# Patient Record
Sex: Female | Born: 1985 | Race: Black or African American | Hispanic: No | Marital: Single | Smoking: Never smoker
Health system: Southern US, Community
[De-identification: ages and names within clinical notes are randomized; demographics above are authoritative.]

## PROBLEM LIST (undated history)

## (undated) DIAGNOSIS — J45909 Unspecified asthma, uncomplicated: Secondary | ICD-10-CM

---
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
## 2008-03-27 ENCOUNTER — Emergency Department (HOSPITAL_COMMUNITY): Admission: EM | Admit: 2008-03-27 | Discharge: 2008-03-27 | Payer: Self-pay | Admitting: Emergency Medicine

## 2008-07-15 ENCOUNTER — Inpatient Hospital Stay (HOSPITAL_COMMUNITY): Admission: AD | Admit: 2008-07-15 | Discharge: 2008-07-15 | Payer: Self-pay | Admitting: Obstetrics and Gynecology

## 2008-08-20 ENCOUNTER — Other Ambulatory Visit: Payer: Self-pay | Admitting: Emergency Medicine

## 2008-08-20 ENCOUNTER — Inpatient Hospital Stay (HOSPITAL_COMMUNITY): Admission: AD | Admit: 2008-08-20 | Discharge: 2008-08-20 | Payer: Self-pay | Admitting: Obstetrics & Gynecology

## 2008-10-04 ENCOUNTER — Inpatient Hospital Stay (HOSPITAL_COMMUNITY): Admission: AD | Admit: 2008-10-04 | Discharge: 2008-10-07 | Payer: Self-pay | Admitting: Obstetrics and Gynecology

## 2009-04-19 ENCOUNTER — Emergency Department (HOSPITAL_COMMUNITY): Admission: EM | Admit: 2009-04-19 | Discharge: 2009-04-19 | Payer: Self-pay | Admitting: Emergency Medicine

## 2009-11-04 IMAGING — US US OB LIMITED
1 series · 14 of 16 positions shown · non-contrast
Comparison: none

OBSTETRICAL ULTRASOUND:
 This ultrasound exam was performed in the [HOSPITAL] Ultrasound Department.  The OB US report was generated in the AS system, and faxed to the ordering physician.  This report is also available in [REDACTED] PACS.

[Series 1: us fetal bpp w/o nonstress · non-contrast · 16 acquisitions, 14 frames shown]
[im 1/16]
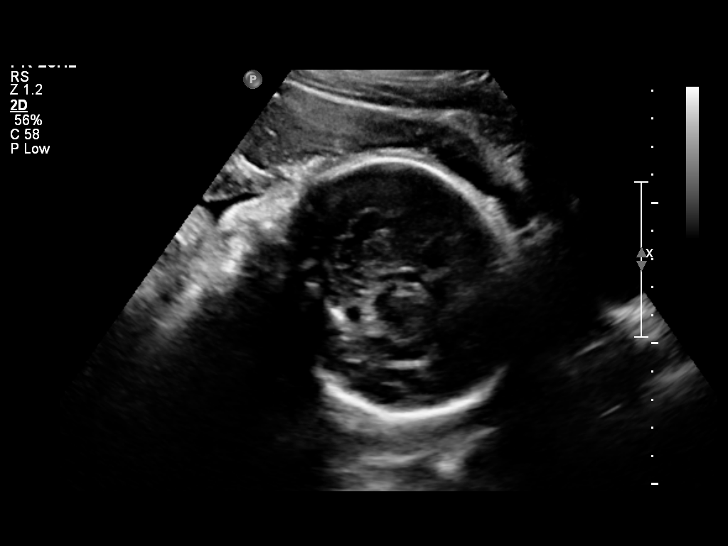
[im 2/16]
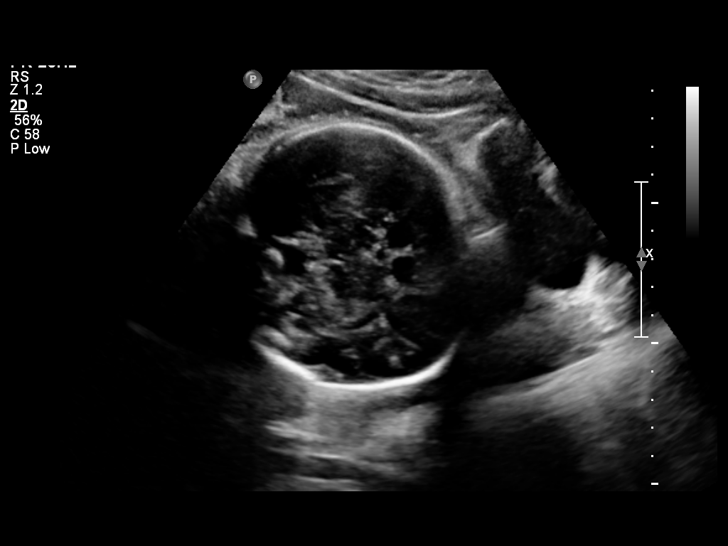
[im 3/16]
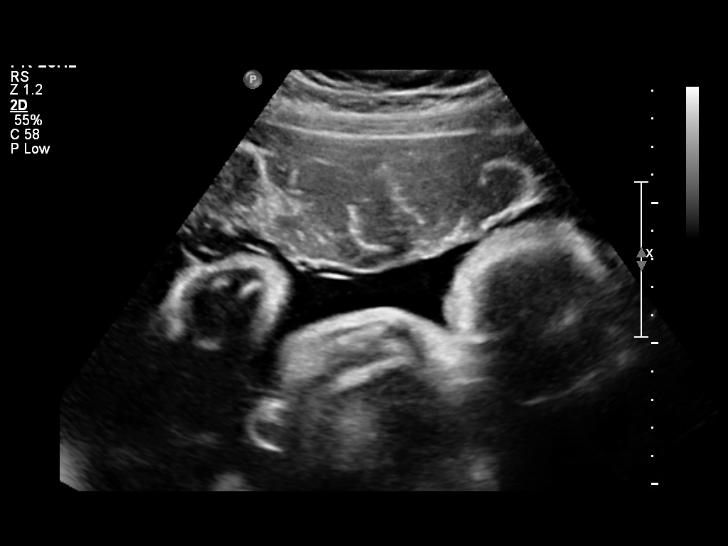
[im 5/16]
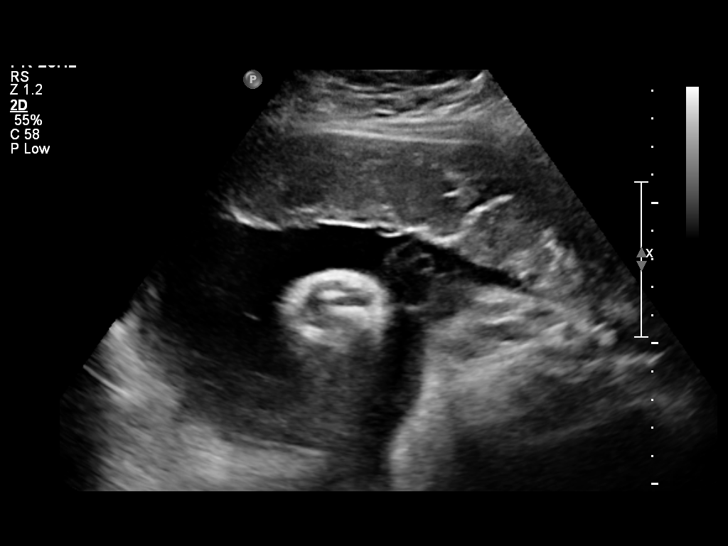
[im 6/16]
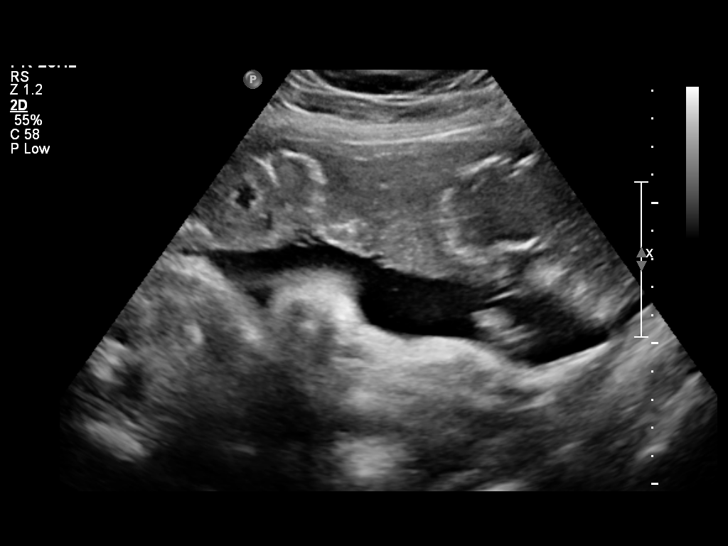
[im 7/16]
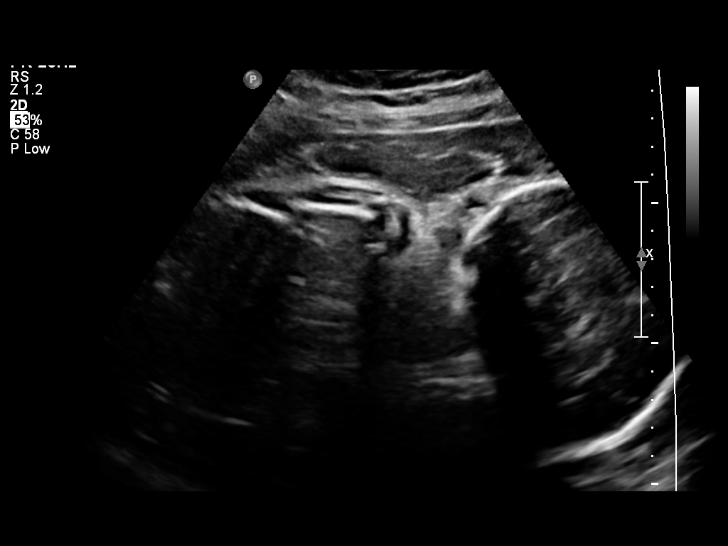
[im 8/16]
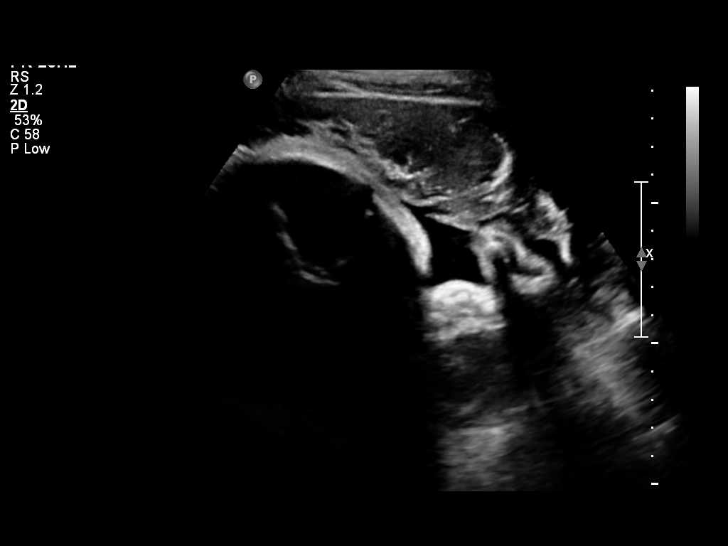
[im 9/16]
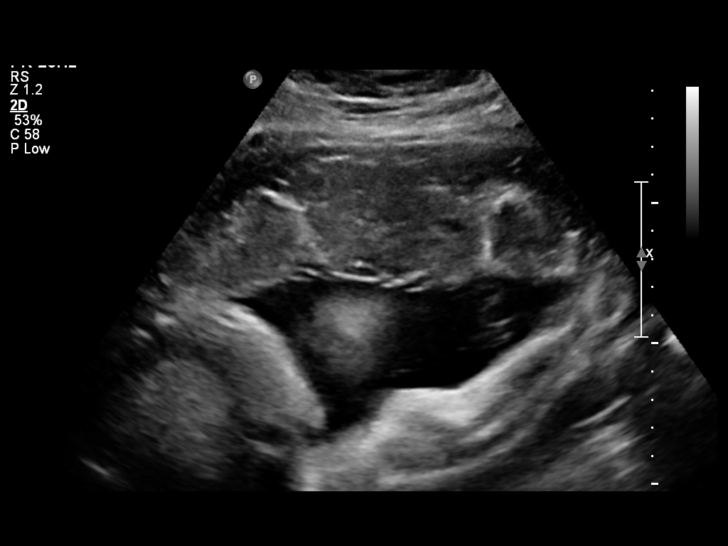
[im 10/16]
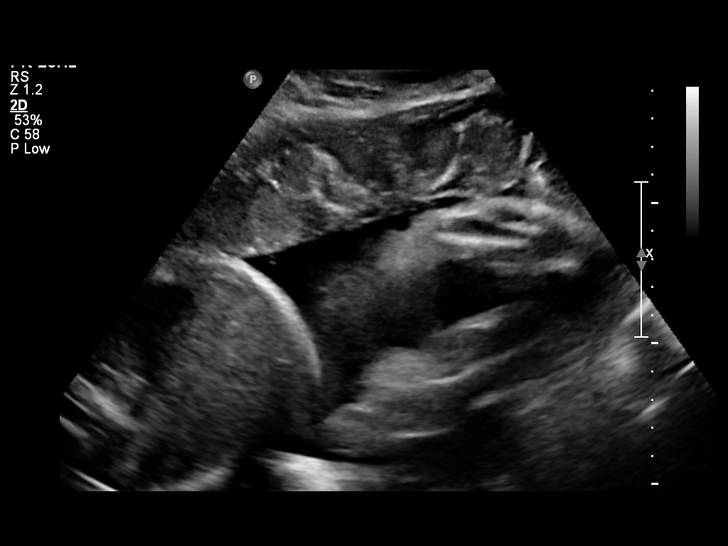
[im 11/16]
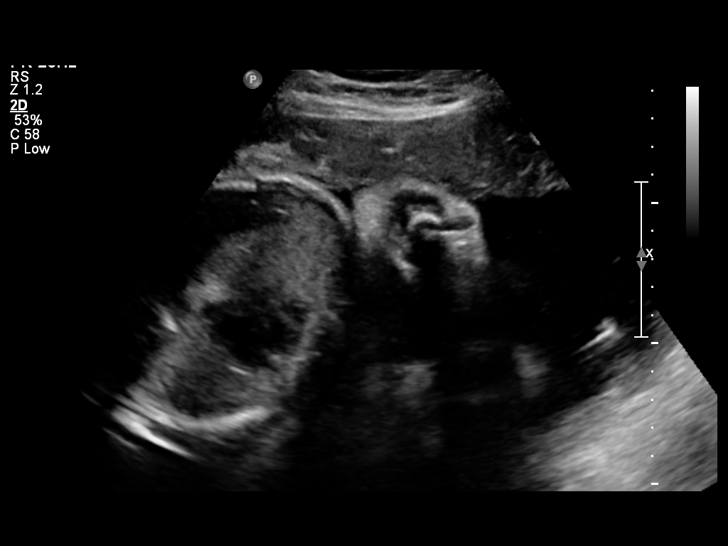
[im 13/16]
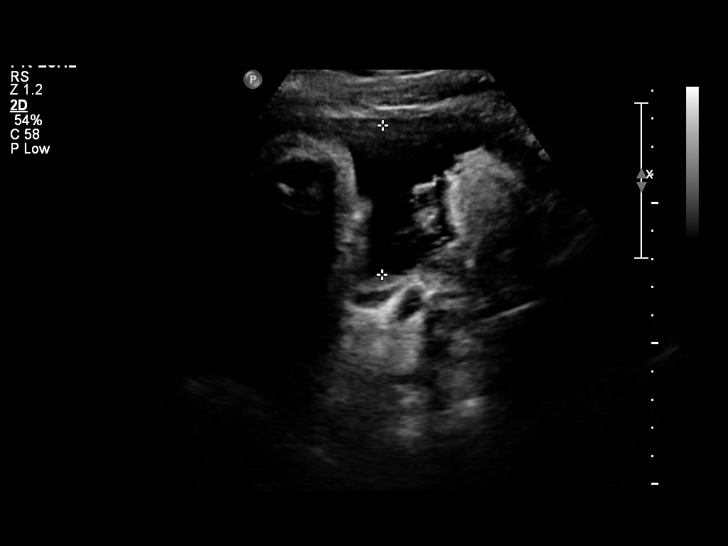
[im 14/16]
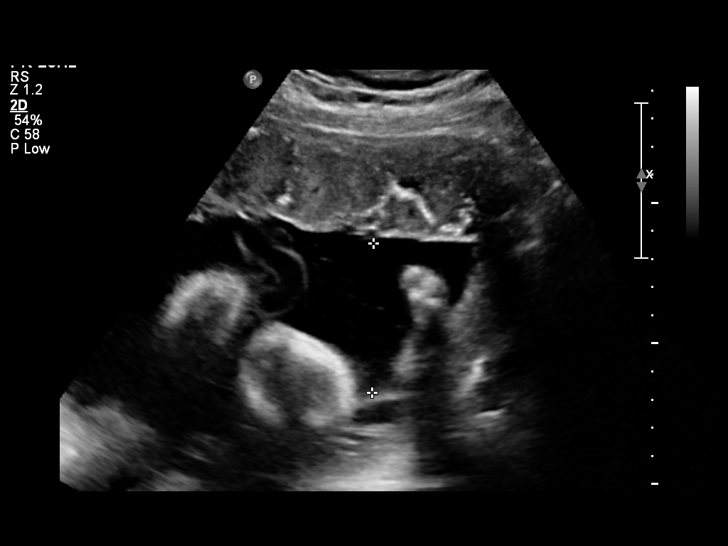
[im 15/16]
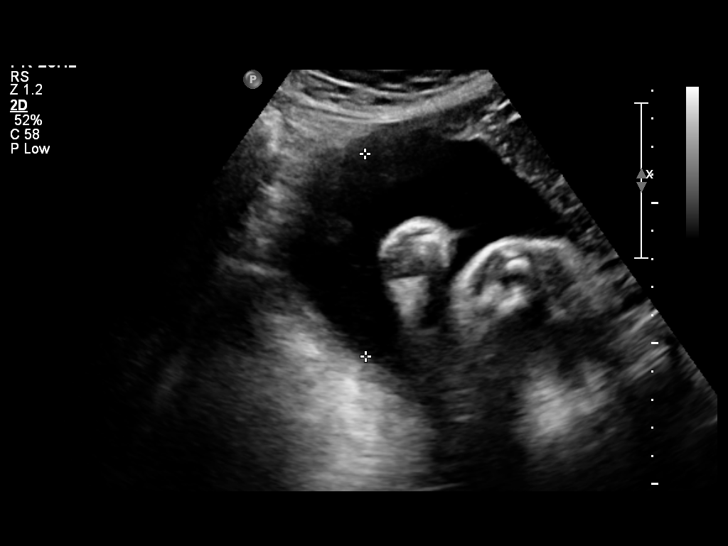
[im 16/16]
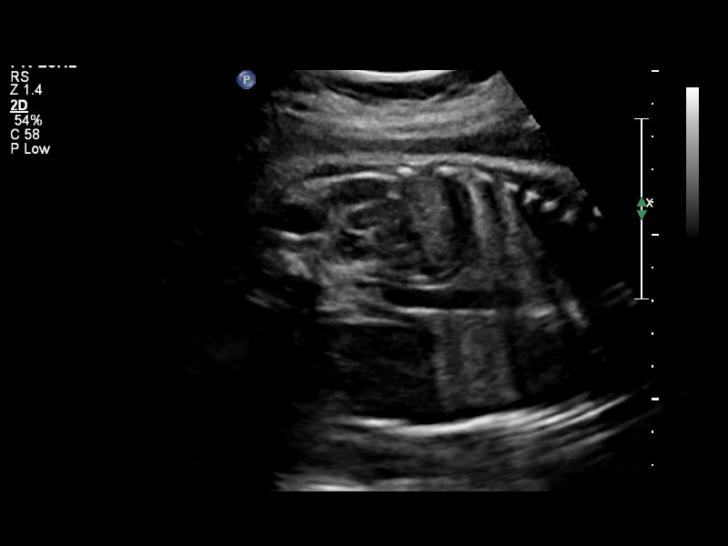

[14 of 16 positions shown; findings below may reference images not displayed]

IMPRESSION: See AS Obstetric US report.

## 2010-06-24 ENCOUNTER — Emergency Department (HOSPITAL_COMMUNITY)
Admission: EM | Admit: 2010-06-24 | Discharge: 2010-06-24 | Payer: Self-pay | Source: Home / Self Care | Admitting: Emergency Medicine

## 2010-09-23 LAB — CBC
HCT: 24.7 % — ABNORMAL LOW (ref 36.0–46.0)
HCT: 27.3 % — ABNORMAL LOW (ref 36.0–46.0)
Hemoglobin: 8.7 g/dL — ABNORMAL LOW (ref 12.0–15.0)
Hemoglobin: 9.6 g/dL — ABNORMAL LOW (ref 12.0–15.0)
MCV: 95.2 fL (ref 78.0–100.0)
Platelets: 211 10*3/uL (ref 150–400)
RBC: 2.84 MIL/uL — ABNORMAL LOW (ref 3.87–5.11)
RBC: 3.74 MIL/uL — ABNORMAL LOW (ref 3.87–5.11)
WBC: 25.1 10*3/uL — ABNORMAL HIGH (ref 4.0–10.5)
WBC: 9.1 10*3/uL (ref 4.0–10.5)

## 2010-09-23 LAB — RPR: RPR Ser Ql: NONREACTIVE

## 2010-09-24 LAB — ABO/RH: ABO/RH(D): O POS

## 2010-12-07 ENCOUNTER — Emergency Department (HOSPITAL_COMMUNITY)
Admission: EM | Admit: 2010-12-07 | Discharge: 2010-12-07 | Disposition: A | Discharge status: Home / Self Care | Payer: Self-pay | Attending: Emergency Medicine | Admitting: Emergency Medicine

## 2010-12-07 DIAGNOSIS — N390 Urinary tract infection, site not specified: Secondary | ICD-10-CM | POA: Insufficient documentation

## 2010-12-07 DIAGNOSIS — R300 Dysuria: Secondary | ICD-10-CM | POA: Insufficient documentation

## 2010-12-07 LAB — URINALYSIS, ROUTINE W REFLEX MICROSCOPIC
Bilirubin Urine: NEGATIVE
Nitrite: POSITIVE — AB
Specific Gravity, Urine: 1.027 (ref 1.005–1.030)
Urobilinogen, UA: 1 mg/dL (ref 0.0–1.0)

## 2010-12-07 LAB — URINE MICROSCOPIC-ADD ON

## 2010-12-10 LAB — URINE CULTURE

## 2011-06-04 ENCOUNTER — Ambulatory Visit: Payer: Self-pay | Admitting: Family Medicine

## 2011-08-12 ENCOUNTER — Ambulatory Visit: Payer: Self-pay | Admitting: Family Medicine

## 2011-08-12 DIAGNOSIS — Z0279 Encounter for issue of other medical certificate: Secondary | ICD-10-CM

## 2012-11-29 ENCOUNTER — Institutional Professional Consult (permissible substitution): Payer: Self-pay | Admitting: Medical

## 2013-07-30 ENCOUNTER — Encounter (HOSPITAL_COMMUNITY): Payer: Self-pay | Admitting: Emergency Medicine

## 2013-07-30 ENCOUNTER — Emergency Department (HOSPITAL_COMMUNITY)
Admission: EM | Admit: 2013-07-30 | Discharge: 2013-07-30 | Disposition: A | Discharge status: Home / Self Care | Payer: BC Managed Care – PPO | Attending: Emergency Medicine | Admitting: Emergency Medicine

## 2013-07-30 DIAGNOSIS — Z9109 Other allergy status, other than to drugs and biological substances: Secondary | ICD-10-CM | POA: Insufficient documentation

## 2013-07-30 DIAGNOSIS — Y939 Activity, unspecified: Secondary | ICD-10-CM | POA: Insufficient documentation

## 2013-07-30 DIAGNOSIS — L509 Urticaria, unspecified: Secondary | ICD-10-CM | POA: Insufficient documentation

## 2013-07-30 DIAGNOSIS — J45909 Unspecified asthma, uncomplicated: Secondary | ICD-10-CM | POA: Insufficient documentation

## 2013-07-30 DIAGNOSIS — L500 Allergic urticaria: Secondary | ICD-10-CM | POA: Insufficient documentation

## 2013-07-30 DIAGNOSIS — IMO0001 Reserved for inherently not codable concepts without codable children: Secondary | ICD-10-CM | POA: Insufficient documentation

## 2013-07-30 DIAGNOSIS — Y929 Unspecified place or not applicable: Secondary | ICD-10-CM | POA: Insufficient documentation

## 2013-07-30 DIAGNOSIS — T4995XA Adverse effect of unspecified topical agent, initial encounter: Secondary | ICD-10-CM | POA: Insufficient documentation

## 2013-07-30 HISTORY — DX: Unspecified asthma, uncomplicated: J45.909

## 2013-07-30 LAB — COMPREHENSIVE METABOLIC PANEL
ALT: 30 U/L (ref 0–35)
AST: 35 U/L (ref 0–37)
Albumin: 3.3 g/dL — ABNORMAL LOW (ref 3.5–5.2)
Alkaline Phosphatase: 111 U/L (ref 39–117)
BILIRUBIN TOTAL: 0.2 mg/dL — AB (ref 0.3–1.2)
BUN: 8 mg/dL (ref 6–23)
CALCIUM: 8.4 mg/dL (ref 8.4–10.5)
CHLORIDE: 98 meq/L (ref 96–112)
CO2: 22 meq/L (ref 19–32)
CREATININE: 0.83 mg/dL (ref 0.50–1.10)
Glucose, Bld: 102 mg/dL — ABNORMAL HIGH (ref 70–99)
Potassium: 3.7 mEq/L (ref 3.7–5.3)
Sodium: 134 mEq/L — ABNORMAL LOW (ref 137–147)
Total Protein: 7.4 g/dL (ref 6.0–8.3)

## 2013-07-30 LAB — CG4 I-STAT (LACTIC ACID): LACTIC ACID, VENOUS: 1.31 mmol/L (ref 0.5–2.2)

## 2013-07-30 LAB — CBC WITH DIFFERENTIAL/PLATELET
BASOS ABS: 0 10*3/uL (ref 0.0–0.1)
Basophils Relative: 1 % (ref 0–1)
EOS PCT: 0 % (ref 0–5)
Eosinophils Absolute: 0 10*3/uL (ref 0.0–0.7)
HEMATOCRIT: 43.6 % (ref 36.0–46.0)
HEMOGLOBIN: 15.4 g/dL — AB (ref 12.0–15.0)
LYMPHS PCT: 40 % (ref 12–46)
Lymphs Abs: 2 10*3/uL (ref 0.7–4.0)
MCH: 32.2 pg (ref 26.0–34.0)
MCHC: 35.3 g/dL (ref 30.0–36.0)
MCV: 91.2 fL (ref 78.0–100.0)
MONO ABS: 0.2 10*3/uL (ref 0.1–1.0)
MONOS PCT: 4 % (ref 3–12)
NEUTROS ABS: 2.7 10*3/uL (ref 1.7–7.7)
Neutrophils Relative %: 56 % (ref 43–77)
Platelets: 290 10*3/uL (ref 150–400)
RBC: 4.78 MIL/uL (ref 3.87–5.11)
RDW: 12.4 % (ref 11.5–15.5)
WBC: 4.9 10*3/uL (ref 4.0–10.5)

## 2013-07-30 LAB — URINALYSIS, ROUTINE W REFLEX MICROSCOPIC
Bilirubin Urine: NEGATIVE
Glucose, UA: NEGATIVE mg/dL
Ketones, ur: NEGATIVE mg/dL
NITRITE: NEGATIVE
PROTEIN: NEGATIVE mg/dL
SPECIFIC GRAVITY, URINE: 1.019 (ref 1.005–1.030)
UROBILINOGEN UA: 0.2 mg/dL (ref 0.0–1.0)
pH: 6 (ref 5.0–8.0)

## 2013-07-30 LAB — URINE MICROSCOPIC-ADD ON

## 2013-07-30 MED ORDER — SODIUM CHLORIDE 0.9 % IV SOLN
1000.0000 mL | Freq: Once | INTRAVENOUS | Status: AC
  Administered 2013-07-30: 1000 mL via INTRAVENOUS

## 2013-07-30 MED ORDER — METHYLPREDNISOLONE SODIUM SUCC 125 MG IJ SOLR
125.0000 mg | Freq: Once | INTRAMUSCULAR | Status: AC
  Administered 2013-07-30: 125 mg via INTRAVENOUS
  Filled 2013-07-30: qty 2

## 2013-07-30 MED ORDER — PREDNISONE 20 MG PO TABS: 60.0000 mg | ORAL_TABLET | Freq: Every day | ORAL | Status: DC

## 2013-07-30 MED ORDER — FAMOTIDINE IN NACL 20-0.9 MG/50ML-% IV SOLN
20.0000 mg | Freq: Once | INTRAVENOUS | Status: AC
  Administered 2013-07-30: 20 mg via INTRAVENOUS
  Filled 2013-07-30: qty 50

## 2013-07-30 MED ORDER — DIPHENHYDRAMINE HCL 50 MG/ML IJ SOLN
25.0000 mg | Freq: Once | INTRAMUSCULAR | Status: AC
  Administered 2013-07-30: 25 mg via INTRAVENOUS
  Filled 2013-07-30: qty 1

## 2013-07-30 MED ORDER — SODIUM CHLORIDE 0.9 % IV SOLN
1000.0000 mL | INTRAVENOUS | Status: DC
  Administered 2013-07-30: 1000 mL via INTRAVENOUS

## 2013-07-30 NOTE — ED Provider Notes (Signed)
CSN: [id number]     Arrival date & time 07/30/13  0123 History   First MD Initiated Contact with Patient 07/30/13 0202     Chief Complaint  Patient presents with  . Allergic Reaction     (Consider location/radiation/quality/duration/timing/severity/associated sxs/prior Treatment) Patient is a 28 y.o. female presenting with allergic reaction. The history is provided by the patient.  Allergic Reaction She started breaking out in hives said 2 days ago and has been getting progressively worse. The hives are associated with generalized itching. She is treated herself with Benadryl with no relief. She is also used topical hydrocortisone, and a variety of other topical measures with no relief. She's not been exposed to anything new. She's not had problems with hives before.  Past Medical History  Diagnosis Date  . Asthma    History reviewed. No pertinent past surgical history. No family history on file. History  Substance Use Topics  . Smoking status: Never Smoker   . Smokeless tobacco: Not on file  . Alcohol Use: Yes   OB History   Grav Para Term Preterm Abortions TAB SAB Ect Mult Living                 Review of Systems  All other systems reviewed and are negative.      Allergies  Bee venom and Other  Home Medications   Current Outpatient Rx  Name  Route  Sig  Dispense  Refill  . bismuth subsalicylate (PEPTO BISMOL) 262 MG/15ML suspension   Oral   Take 30 mLs by mouth every 6 (six) hours as needed for indigestion.         . diphenhydrAMINE (BENADRYL) 25 MG tablet   Oral   Take 25 mg by mouth every 6 (six) hours as needed for itching.         . magnesium oxide (MAG-OX) 400 (241.3 MG) MG tablet   Oral   Take 400 mg by mouth 2 (two) times daily.         . minocycline (MINOCIN,DYNACIN) 100 MG capsule   Oral   Take 100 mg by mouth 2 (two) times daily.         Marland Kitchen OVER THE COUNTER MEDICATION   Oral   Take 1 tablet by mouth daily. Emergen-c supplement          . Phenylephrine-Pheniramine-DM (THERAFLU COLD & COUGH PO)   Oral   Take 2 tablets by mouth daily as needed (for congestion).         . Vitamin D, Ergocalciferol, (DRISDOL) 50000 UNITS CAPS capsule   Oral   Take 50,000 Units by mouth every 7 (seven) days. Take on Thursday          BP 118/68  Pulse 118  Temp(Src) 98.1 F (36.7 C) (Oral)  Resp 14  SpO2 99%  LMP 07/29/2013 Physical Exam  Nursing note and vitals reviewed.  28 year old female, resting comfortably and in no acute distress. Vital signs are significant for tachycardia with heart rate of 118. Oxygen saturation is 99%, which is normal. Head is normocephalic and atraumatic. PERRLA, EOMI. Oropharynx is clear. She has no difficulty with her secretions and phonation is normal area Neck is nontender and supple without adenopathy or JVD. Back is nontender and there is no CVA tenderness. Lungs are clear without rales, wheezes, or rhonchi. There is no stridor. Chest is nontender. Heart has regular rate and rhythm without murmur. Abdomen is soft, flat, nontender without masses or hepatosplenomegaly and peristalsis  is normoactive. Extremities have no cyanosis or edema, full range of motion is present. Skin has scattered urticarial lesions. Neurologic: Mental status is normal, cranial nerves are intact, there are no motor or sensory deficits.  ED Course  Procedures (including critical care time) Labs Review Results for orders placed during the hospital encounter of 07/30/13  CBC WITH DIFFERENTIAL      Result Value Ref Range   WBC 4.9  4.0 - 10.5 K/uL   RBC 4.78  3.87 - 5.11 MIL/uL   Hemoglobin 15.4 (*) 12.0 - 15.0 g/dL   HCT 16.1  09.6 - 04.5 %   MCV 91.2  78.0 - 100.0 fL   MCH 32.2  26.0 - 34.0 pg   MCHC 35.3  30.0 - 36.0 g/dL   RDW 40.9  81.1 - 91.4 %   Platelets 290  150 - 400 K/uL   Neutrophils Relative % 56  43 - 77 %   Neutro Abs 2.7  1.7 - 7.7 K/uL   Lymphocytes Relative 40  12 - 46 %   Lymphs Abs 2.0  0.7  - 4.0 K/uL   Monocytes Relative 4  3 - 12 %   Monocytes Absolute 0.2  0.1 - 1.0 K/uL   Eosinophils Relative 0  0 - 5 %   Eosinophils Absolute 0.0  0.0 - 0.7 K/uL   Basophils Relative 1  0 - 1 %   Basophils Absolute 0.0  0.0 - 0.1 K/uL  COMPREHENSIVE METABOLIC PANEL      Result Value Ref Range   Sodium 134 (*) 137 - 147 mEq/L   Potassium 3.7  3.7 - 5.3 mEq/L   Chloride 98  96 - 112 mEq/L   CO2 22  19 - 32 mEq/L   Glucose, Bld 102 (*) 70 - 99 mg/dL   BUN 8  6 - 23 mg/dL   Creatinine, Ser 7.82  0.50 - 1.10 mg/dL   Calcium 8.4  8.4 - 95.6 mg/dL   Total Protein 7.4  6.0 - 8.3 g/dL   Albumin 3.3 (*) 3.5 - 5.2 g/dL   AST 35  0 - 37 U/L   ALT 30  0 - 35 U/L   Alkaline Phosphatase 111  39 - 117 U/L   Total Bilirubin 0.2 (*) 0.3 - 1.2 mg/dL   GFR calc non Af Amer >90  >90 mL/min   GFR calc Af Amer >90  >90 mL/min  URINALYSIS, ROUTINE W REFLEX MICROSCOPIC      Result Value Ref Range   Color, Urine YELLOW  YELLOW   APPearance CLOUDY (*) CLEAR   Specific Gravity, Urine 1.019  1.005 - 1.030   pH 6.0  5.0 - 8.0   Glucose, UA NEGATIVE  NEGATIVE mg/dL   Hgb urine dipstick LARGE (*) NEGATIVE   Bilirubin Urine NEGATIVE  NEGATIVE   Ketones, ur NEGATIVE  NEGATIVE mg/dL   Protein, ur NEGATIVE  NEGATIVE mg/dL   Urobilinogen, UA 0.2  0.0 - 1.0 mg/dL   Nitrite NEGATIVE  NEGATIVE   Leukocytes, UA SMALL (*) NEGATIVE  URINE MICROSCOPIC-ADD ON      Result Value Ref Range   Squamous Epithelial / LPF MANY (*) RARE   WBC, UA 3-6  <3 WBC/hpf   RBC / HPF 11-20  <3 RBC/hpf   Bacteria, UA FEW (*) RARE   Urine-Other MUCOUS PRESENT    CG4 I-STAT (LACTIC ACID)      Result Value Ref Range   Lactic Acid, Venous 1.31  0.5 -  2.2 mmol/L    EKG Interpretation    Date/Time:  Monday July 30 2013 01:38:21 EST Ventricular Rate:  128 PR Interval:  94 QRS Duration: 68 QT Interval:  365 QTC Calculation: 533 R Axis:   61 Text Interpretation:  Sinus tachycardia Borderline T abnormalities, diffuse leads  Prolonged QT interval No old tracing to compare Confirmed by [facility]  MD, Craige Patel ([id number]) on 07/30/2013 2:03:24 AM           CRITICAL CARE Performed by: WUJWJ,XBJYNGLICK,Doreena Maulden Total critical care time: 50 minutes Critical care time was exclusive of separately billable procedures and treating other patients. Critical care was necessary to treat or prevent imminent or life-threatening deterioration. Critical care was time spent personally by me on the following activities: development of treatment plan with patient and/or surrogate as well as nursing, discussions with consultants, evaluation of patient's response to treatment, examination of patient, obtaining history from patient or surrogate, ordering and performing treatments and interventions, ordering and review of laboratory studies, ordering and review of radiographic studies, pulse oximetry and re-evaluation of patient's condition.  MDM   Final diagnoses:  Urticaria    Acute and persistent urticaria. Because of tachycardia, and she's given IV fluids. She is treated with IV diphenhydramine, famotidine, and methylprednisolone.  3:38 AM Rash seems to have faded somewhat but she states she still stings when she gets up and walks After 1 L of normal saline, heart rate is down to 111 and second liter is infusing  5:02 AM There's been further fading of the rash and she is feeling better. She still is mildly tachycardic at 110, but does not appear toxic in any way. She is heavily shown substantial improvement in the ED. She is discharged with a prescription for prednisone and is advised to take over-the-counter loratadine and either famotidine or ranitidine.  Dione Boozeavid Jakhia Buxton, MD 07/30/13 [phone]

## 2013-07-30 NOTE — ED Notes (Signed)
Pt states Friday she noticed a small what appeared to be bite on her right hand, stated it didn't go away, and now pt has hives all over body. Hives noted to right eye lid, all over chest, all over legs, pt states hives are on her buttocks as well. Pt states she last took benedryl at 2000 last night. Pt in NAD. Pt states they are itchy. HR 127. Pt states she does not know what she came in contact with to cause this reaction. 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

## 2013-07-30 NOTE — Discharge Instructions (Signed)
Take Claritin or Zyrtec once a day. Take Zantac or Pepcid twice a day. Store brands work as well as the name brands.  Hives Hives are itchy, red, swollen areas of the skin. They can vary in size and location on your body. Hives can come and go for hours or several days (acute hives) or for several weeks (chronic hives). Hives do not spread from person to person (noncontagious). They may get worse with scratching, exercise, and emotional stress. CAUSES   Allergic reaction to food, additives, or drugs.  Infections, including the common cold.  Illness, such as vasculitis, lupus, or thyroid disease.  Exposure to sunlight, heat, or cold.  Exercise.  Stress.  Contact with chemicals. SYMPTOMS   Red or white swollen patches on the skin. The patches may change size, shape, and location quickly and repeatedly.  Itching.  Swelling of the hands, feet, and face. This may occur if hives develop deeper in the skin. DIAGNOSIS  Your caregiver can usually tell what is wrong by performing a physical exam. Skin or blood tests may also be done to determine the cause of your hives. In some cases, the cause cannot be determined. TREATMENT  Mild cases usually get better with medicines such as antihistamines. Severe cases may require an emergency epinephrine injection. If the cause of your hives is known, treatment includes avoiding that trigger.  HOME CARE INSTRUCTIONS   Avoid causes that trigger your hives.  Take antihistamines as directed by your caregiver to reduce the severity of your hives. Non-sedating or low-sedating antihistamines are usually recommended. Do not drive while taking an antihistamine.  Take any other medicines prescribed for itching as directed by your caregiver.  Wear loose-fitting clothing.  Keep all follow-up appointments as directed by your caregiver. SEEK MEDICAL CARE IF:   You have persistent or severe itching that is not relieved with medicine.  You have painful or  swollen joints. SEEK IMMEDIATE MEDICAL CARE IF:   You have a fever.  Your tongue or lips are swollen.  You have trouble breathing or swallowing.  You feel tightness in the throat or chest.  You have abdominal pain. These problems may be the first sign of a life-threatening allergic reaction. Call your local emergency services (911 in [place] MAKE SURE YOU:   Understand these instructions.  Will watch your condition.  Will get help right away if you are not doing well or get worse. Document Released: 05/31/2005 Document Revised: 11/30/2011 Document Reviewed: 08/24/2011 [facility] Patient Information 2014 [place], [place].  Prednisone tablets What is this medicine? PREDNISONE (PRED ni sone) is a corticosteroid. It is commonly used to treat inflammation of the skin, joints, lungs, and other organs. Common conditions treated include asthma, allergies, and arthritis. It is also used for other conditions, such as blood disorders and diseases of the adrenal glands. This medicine may be used for other purposes; ask your health care provider or pharmacist if you have questions. COMMON BRAND NAME(S): Deltasone, Predone, Sterapred DS, Sterapred What should I tell my health care provider before I take this medicine? They need to know if you have any of these conditions: -Cushing's syndrome -diabetes -glaucoma -heart disease -high blood pressure -infection (especially a virus infection such as chickenpox, cold sores, or herpes) -kidney disease -liver disease -mental illness -myasthenia gravis -osteoporosis -seizures -stomach or intestine problems -thyroid disease -an unusual or allergic reaction to lactose, prednisone, other medicines, foods, dyes, or preservatives -pregnant or trying to get pregnant -breast-feeding How should I use this medicine? Take  this medicine by mouth with a glass of water. Follow the directions on the prescription label. Take this medicine with food. If you are  taking this medicine once a day, take it in the morning. Do not take more medicine than you are told to take. Do not suddenly stop taking your medicine because you may develop a severe reaction. Your doctor will tell you how much medicine to take. If your doctor wants you to stop the medicine, the dose may be slowly lowered over time to avoid any side effects. Talk to your pediatrician regarding the use of this medicine in children. Special care may be needed. Overdosage: If you think you have taken too much of this medicine contact a poison control center or emergency room at once. NOTE: This medicine is only for you. Do not share this medicine with others. What if I miss a dose? If you miss a dose, take it as soon as you can. If it is almost time for your next dose, talk to your doctor or health care professional. You may need to miss a dose or take an extra dose. Do not take double or extra doses without advice. What may interact with this medicine? Do not take this medicine with any of the following medications: -metyrapone -mifepristone This medicine may also interact with the following medications: -aminoglutethimide -amphotericin B -aspirin and aspirin-like medicines -barbiturates -certain medicines for diabetes, like glipizide or glyburide -cholestyramine -cholinesterase inhibitors -cyclosporine -digoxin -diuretics -ephedrine -female hormones, like estrogens and birth control pills -isoniazid -ketoconazole -NSAIDS, medicines for pain and inflammation, like ibuprofen or naproxen -phenytoin -rifampin -toxoids -vaccines -warfarin This list may not describe all possible interactions. Give your health care provider a list of all the medicines, herbs, non-prescription drugs, or dietary supplements you use. Also tell them if you smoke, drink alcohol, or use illegal drugs. Some items may interact with your medicine. What should I watch for while using this medicine? Visit your doctor  or health care professional for regular checks on your progress. If you are taking this medicine over a prolonged period, carry an identification card with your name and address, the type and dose of your medicine, and your doctor's name and address. This medicine may increase your risk of getting an infection. Tell your doctor or health care professional if you are around anyone with measles or chickenpox, or if you develop sores or blisters that do not heal properly. If you are going to have surgery, tell your doctor or health care professional that you have taken this medicine within the last twelve months. Ask your doctor or health care professional about your diet. You may need to lower the amount of salt you eat. This medicine may affect blood sugar levels. If you have diabetes, check with your doctor or health care professional before you change your diet or the dose of your diabetic medicine. What side effects may I notice from receiving this medicine? Side effects that you should report to your doctor or health care professional as soon as possible: -allergic reactions like skin rash, itching or hives, swelling of the face, lips, or tongue -changes in emotions or moods -changes in vision -depressed mood -eye pain -fever or chills, cough, sore throat, pain or difficulty passing urine -increased thirst -swelling of ankles, feet Side effects that usually do not require medical attention (report to your doctor or health care professional if they continue or are bothersome): -confusion, excitement, restlessness -headache -nausea, vomiting -skin problems, acne, thin and shiny  skin -trouble sleeping -weight gain This list may not describe all possible side effects. Call your doctor for medical advice about side effects. You may report side effects to FDA at 1-800-FDA-1088. Where should I keep my medicine? Keep out of the reach of children. Store at room temperature between 15 and 30  degrees C (59 and 86 degrees F). Protect from light. Keep container tightly closed. Throw away any unused medicine after the expiration date. NOTE: This sheet is a summary. It may not cover all possible information. If you have questions about this medicine, talk to your doctor, pharmacist, or health care provider.  2014, [employer]. (2011-01-14 10:57:14)

## 2013-07-30 NOTE — ED Notes (Signed)
Pt medications in MAR and BP reviewed with Dr. Preston FleetingGlick.  Pt ambulatory, denies lightheadedness and feeling dizzy.  Pt advised if she felt tired to wait in the waiting room and call a friend to come get her at the ED prior to getting behind the wheel to drive.  Pt acknowledged.

## 2013-09-18 ENCOUNTER — Telehealth: Payer: Self-pay | Admitting: Internal Medicine

## 2013-09-18 NOTE — Telephone Encounter (Signed)
Pt called today to schedule a new pt appt. i notified pt and told her she could no be seen since she no showed an appt back in June 2014. Pt states she called 2 days prior to cancel. Do I schedule pt for a new appt or no? Please call pt and notify her

## 2013-09-20 NOTE — Telephone Encounter (Signed)
I reviewed her history.  It appears there are 3 other new get established appts with other offices that she NO SHOWED for on the first visit.  So we will not reschedule her.

## 2013-09-20 NOTE — Telephone Encounter (Signed)
Pt was notified and she said she found another doctor

## 2014-01-14 ENCOUNTER — Emergency Department (HOSPITAL_COMMUNITY): Payer: BC Managed Care – PPO

## 2014-01-14 ENCOUNTER — Encounter (HOSPITAL_COMMUNITY): Payer: Self-pay | Admitting: Emergency Medicine

## 2014-01-14 ENCOUNTER — Emergency Department (HOSPITAL_COMMUNITY)
Admission: EM | Admit: 2014-01-14 | Discharge: 2014-01-15 | Disposition: A | Discharge status: Home / Self Care | Payer: BC Managed Care – PPO | Attending: Emergency Medicine | Admitting: Emergency Medicine

## 2014-01-14 DIAGNOSIS — O26891 Other specified pregnancy related conditions, first trimester: Secondary | ICD-10-CM

## 2014-01-14 DIAGNOSIS — N898 Other specified noninflammatory disorders of vagina: Secondary | ICD-10-CM | POA: Insufficient documentation

## 2014-01-14 DIAGNOSIS — O9989 Other specified diseases and conditions complicating pregnancy, childbirth and the puerperium: Secondary | ICD-10-CM | POA: Insufficient documentation

## 2014-01-14 DIAGNOSIS — J45909 Unspecified asthma, uncomplicated: Secondary | ICD-10-CM | POA: Insufficient documentation

## 2014-01-14 DIAGNOSIS — N949 Unspecified condition associated with female genital organs and menstrual cycle: Secondary | ICD-10-CM | POA: Insufficient documentation

## 2014-01-14 DIAGNOSIS — R1031 Right lower quadrant pain: Secondary | ICD-10-CM | POA: Insufficient documentation

## 2014-01-14 DIAGNOSIS — R102 Pelvic and perineal pain: Secondary | ICD-10-CM

## 2014-01-14 LAB — URINALYSIS, ROUTINE W REFLEX MICROSCOPIC
BILIRUBIN URINE: NEGATIVE
GLUCOSE, UA: NEGATIVE mg/dL
HGB URINE DIPSTICK: NEGATIVE
KETONES UR: NEGATIVE mg/dL
Nitrite: NEGATIVE
PH: 7 (ref 5.0–8.0)
PROTEIN: NEGATIVE mg/dL
Specific Gravity, Urine: 1.013 (ref 1.005–1.030)
Urobilinogen, UA: 0.2 mg/dL (ref 0.0–1.0)

## 2014-01-14 LAB — COMPREHENSIVE METABOLIC PANEL
ALT: 12 U/L (ref 0–35)
AST: 13 U/L (ref 0–37)
Albumin: 3.6 g/dL (ref 3.5–5.2)
Alkaline Phosphatase: 72 U/L (ref 39–117)
Anion gap: 13 (ref 5–15)
BUN: 8 mg/dL (ref 6–23)
CHLORIDE: 103 meq/L (ref 96–112)
CO2: 22 meq/L (ref 19–32)
CREATININE: 0.52 mg/dL (ref 0.50–1.10)
Calcium: 9.1 mg/dL (ref 8.4–10.5)
GFR calc Af Amer: >90 mL/min (ref >90)
Glucose, Bld: 83 mg/dL (ref 70–99)
Potassium: 4.2 mEq/L (ref 3.7–5.3)
Sodium: 138 mEq/L (ref 137–147)
Total Protein: 6.8 g/dL (ref 6.0–8.3)

## 2014-01-14 LAB — CBC WITH DIFFERENTIAL/PLATELET
Basophils Absolute: 0 10*3/uL (ref 0.0–0.1)
Basophils Relative: 0 % (ref 0–1)
Eosinophils Absolute: 0.1 10*3/uL (ref 0.0–0.7)
Eosinophils Relative: 2 % (ref 0–5)
HEMATOCRIT: 34.3 % — AB (ref 36.0–46.0)
HEMOGLOBIN: 11.4 g/dL — AB (ref 12.0–15.0)
LYMPHS PCT: 34 % (ref 12–46)
Lymphs Abs: 2 10*3/uL (ref 0.7–4.0)
MCH: 30.7 pg (ref 26.0–34.0)
MCHC: 33.2 g/dL (ref 30.0–36.0)
MCV: 92.5 fL (ref 78.0–100.0)
MONO ABS: 0.4 10*3/uL (ref 0.1–1.0)
Monocytes Relative: 7 % (ref 3–12)
NEUTROS ABS: 3.3 10*3/uL (ref 1.7–7.7)
Neutrophils Relative %: 57 % (ref 43–77)
Platelets: 266 10*3/uL (ref 150–400)
RBC: 3.71 MIL/uL — ABNORMAL LOW (ref 3.87–5.11)
RDW: 13.1 % (ref 11.5–15.5)
WBC: 5.8 10*3/uL (ref 4.0–10.5)

## 2014-01-14 LAB — URINE MICROSCOPIC-ADD ON

## 2014-01-14 LAB — HCG, QUANTITATIVE, PREGNANCY: hCG, Beta Chain, Quant, S: 32464 m[IU]/mL — ABNORMAL HIGH (ref <5)

## 2014-01-14 NOTE — ED Provider Notes (Signed)
CSN: [id number]     Arrival date & time 01/14/14  1929 History   First MD Initiated Contact with Patient 01/14/14 2256     Chief Complaint  Patient presents with  . Abdominal Pain     (Consider location/radiation/quality/duration/timing/severity/associated sxs/prior Treatment) Patient is a 28 y.o. female presenting with abdominal pain. The history is provided by the patient.  Abdominal Pain She is pregnant with last menstrual period June 16, and started having episodic right upper quadrant pain about 2 days ago. Pain is sharp and only last a few seconds at a time but has been coming more frequently. When present, she rates it at 10/10. There is no associated flank pain and no radiation of pain. There is no nausea or vomiting. She denies fever or chills. She denies urinary difficulties. There has been a slight white vaginal discharge. Pregnancy has been uncomplicated to this point but she has not had her first prenatal visit him. He started on prenatal vitamins. She is gravida 2, para 1, abortus 0.  Past Medical History  Diagnosis Date  . Asthma    History reviewed. No pertinent past surgical history. No family history on file. History  Substance Use Topics  . Smoking status: Never Smoker   . Smokeless tobacco: Not on file  . Alcohol Use: Yes   OB History   Grav Para Term Preterm Abortions TAB SAB Ect Mult Living   1              Review of Systems  Gastrointestinal: Positive for abdominal pain.  All other systems reviewed and are negative.     Allergies  Bee venom and Other  Home Medications   Prior to Admission medications   Not on File   BP 102/59  Pulse 107  Temp(Src) 99 F (37.2 C) (Oral)  Resp 20  Ht 5\' 3"  (1.6 m)  Wt 207 lb (93.895 kg)  BMI 36.68 kg/m2  SpO2 99%  LMP 11/27/2013 Physical Exam  Nursing note and vitals reviewed.  28 year old female, resting comfortably and in no acute distress. Vital signs are significant for mild tachycardia with heart  rate 107. Oxygen saturation is 100%, which is normal. Head is normocephalic and atraumatic. PERRLA, EOMI. Oropharynx is clear. Neck is nontender and supple without adenopathy or JVD. Back is nontender and there is no CVA tenderness. Lungs are clear without rales, wheezes, or rhonchi. Chest is nontender. Heart has regular rate and rhythm without murmur. Abdomen is soft, flat, nontender without masses or hepatosplenomegaly and peristalsis is normoactive. Extremities have no cyanosis or edema, full range of motion is present. Skin is warm and dry without rash. Neurologic: Mental status is normal, cranial nerves are intact, there are no motor or sensory deficits.  ED Course  Procedures (including critical care time) Labs Review Results for orders placed during the hospital encounter of 01/14/14  CBC WITH DIFFERENTIAL      Result Value Ref Range   WBC 5.8  4.0 - 10.5 K/uL   RBC 3.71 (*) 3.87 - 5.11 MIL/uL   Hemoglobin 11.4 (*) 12.0 - 15.0 g/dL   HCT 40.934.3 (*) 81.136.0 - 91.446.0 %   MCV 92.5  78.0 - 100.0 fL   MCH 30.7  26.0 - 34.0 pg   MCHC 33.2  30.0 - 36.0 g/dL   RDW 78.213.1  95.611.5 - 21.315.5 %   Platelets 266  150 - 400 K/uL   Neutrophils Relative % 57  43 - 77 %   Neutro  Abs 3.3  1.7 - 7.7 K/uL   Lymphocytes Relative 34  12 - 46 %   Lymphs Abs 2.0  0.7 - 4.0 K/uL   Monocytes Relative 7  3 - 12 %   Monocytes Absolute 0.4  0.1 - 1.0 K/uL   Eosinophils Relative 2  0 - 5 %   Eosinophils Absolute 0.1  0.0 - 0.7 K/uL   Basophils Relative 0  0 - 1 %   Basophils Absolute 0.0  0.0 - 0.1 K/uL  COMPREHENSIVE METABOLIC PANEL      Result Value Ref Range   Sodium 138  137 - 147 mEq/L   Potassium 4.2  3.7 - 5.3 mEq/L   Chloride 103  96 - 112 mEq/L   CO2 22  19 - 32 mEq/L   Glucose, Bld 83  70 - 99 mg/dL   BUN 8  6 - 23 mg/dL   Creatinine, Ser 1.61  0.50 - 1.10 mg/dL   Calcium 9.1  8.4 - 09.6 mg/dL   Total Protein 6.8  6.0 - 8.3 g/dL   Albumin 3.6  3.5 - 5.2 g/dL   AST 13  0 - 37 U/L   ALT 12  0 -  35 U/L   Alkaline Phosphatase 72  39 - 117 U/L   Total Bilirubin <0.2 (*) 0.3 - 1.2 mg/dL   GFR calc non Af Amer >90  >90 mL/min   GFR calc Af Amer >90  >90 mL/min   Anion gap 13  5 - 15  URINALYSIS, ROUTINE W REFLEX MICROSCOPIC      Result Value Ref Range   Color, Urine YELLOW  YELLOW   APPearance CLEAR  CLEAR   Specific Gravity, Urine 1.013  1.005 - 1.030   pH 7.0  5.0 - 8.0   Glucose, UA NEGATIVE  NEGATIVE mg/dL   Hgb urine dipstick NEGATIVE  NEGATIVE   Bilirubin Urine NEGATIVE  NEGATIVE   Ketones, ur NEGATIVE  NEGATIVE mg/dL   Protein, ur NEGATIVE  NEGATIVE mg/dL   Urobilinogen, UA 0.2  0.0 - 1.0 mg/dL   Nitrite NEGATIVE  NEGATIVE   Leukocytes, UA TRACE (*) NEGATIVE  HCG, QUANTITATIVE, PREGNANCY      Result Value Ref Range   hCG, Beta Chain, Quant, S 32464 (*) <5 mIU/mL  URINE MICROSCOPIC-ADD ON      Result Value Ref Range   Squamous Epithelial / LPF MANY (*) RARE   WBC, UA 0-2  <3 WBC/hpf   RBC / HPF 0-2  <3 RBC/hpf   Bacteria, UA RARE  RARE   Imaging Review US Ob Comp Less 14 Wks  01/15/2014   CLINICAL DATA:  Pelvic pain and pregnancy  EXAM: OBSTETRIC <14 WK [place] AND TRANSVAGINAL OB US  TECHNIQUE: Both transabdominal and transvaginal ultrasound examinations were performed for complete evaluation of the gestation as well as the maternal uterus, adnexal regions, and pelvic cul-de-sac. Transvaginal technique was performed to assess early pregnancy.  COMPARISON:  None of this gestation.  FINDINGS: Intrauterine gestational sac: Visualized/normal in shape.  Yolk sac:  Present  Embryo:  Present  Cardiac Activity: Present  Heart Rate:  141 bpm  CRL:   10.6  mm   7 w 1 d                  [place] EDC: 09/01/2014  Maternal uterus/adnexae: Dominant follicle in the right ovary. No adnexal mass. No significant free pelvic fluid. Along the lower margin of the gestational sac is a  small subchronic hemorrhage which measures 7 mm in maximal dimension.  IMPRESSION: 1. Single living intrauterine gestation,  estimated age 29 weeks 1 day. 2. Small subchronic hemorrhage.   Electronically Signed   By: Tiburcio Pea M.D.   On: 01/15/2014 00:31   US Ob Transvaginal  01/15/2014   CLINICAL DATA:  Pelvic pain and pregnancy  EXAM: OBSTETRIC <14 WK [place] AND TRANSVAGINAL OB US  TECHNIQUE: Both transabdominal and transvaginal ultrasound examinations were performed for complete evaluation of the gestation as well as the maternal uterus, adnexal regions, and pelvic cul-de-sac. Transvaginal technique was performed to assess early pregnancy.  COMPARISON:  None of this gestation.  FINDINGS: Intrauterine gestational sac: Visualized/normal in shape.  Yolk sac:  Present  Embryo:  Present  Cardiac Activity: Present  Heart Rate:  141 bpm  CRL:   10.6  mm   7 w 1 d                  [place] EDC: 09/01/2014  Maternal uterus/adnexae: Dominant follicle in the right ovary. No adnexal mass. No significant free pelvic fluid. Along the lower margin of the gestational sac is a small subchronic hemorrhage which measures 7 mm in maximal dimension.  IMPRESSION: 1. Single living intrauterine gestation, estimated age 29 weeks 1 day. 2. Small subchronic hemorrhage.   Electronically Signed   By: Tiburcio Pea M.D.   On: 01/15/2014 00:31     MDM   Final diagnoses:  Pelvic pain affecting pregnancy in first trimester, antepartum    Intermittent right lower quadrant pain in early pregnancy. This is unlikely to be anything serious but ultrasound will be obtained to rule out ectopic pregnancy.  Ultrasound shows intrauterine pregnancy with possible small subchorionic hemorrhage. This does not account for her pain. Cause for her pain is unclear but no evidence of ectopic pregnancy. She has an appointment with her psychologist in 2 days. She states she has to get to work and is anxious to get home and prefers that the pelvic exam be deferred until she sees her gynecologist.  Dione Booze, MD 01/15/14 [phone]

## 2014-01-14 NOTE — ED Notes (Signed)
Pt. reports intermittent RLQ pain onset 2 days ago , denies nausea/vomitting or diarrhea . No fever or chills. Pt. Is [redacted] weeks pregnant (G2P1) no vaginal spotting or discharge .

## 2014-01-14 NOTE — ED Notes (Signed)
Patient transported to Ultrasound 

## 2014-01-15 LAB — RPR

## 2014-01-15 LAB — HIV ANTIBODY (ROUTINE TESTING W REFLEX): HIV 1&2 Ab, 4th Generation: NONREACTIVE

## 2014-01-15 NOTE — ED Notes (Signed)
Pt. Verbalized understanding of discharge paperwork. Denies further needs at this time. 

## 2014-01-15 NOTE — Discharge Instructions (Signed)
Take acetaminophen as needed for pain. Return if symptoms are getting worse.   Abdominal Pain During Pregnancy Abdominal pain is common in pregnancy. Most of the time, it does not cause harm. There are many causes of abdominal pain. Some causes are more serious than others. Some of the causes of abdominal pain in pregnancy are easily diagnosed. Occasionally, the diagnosis takes time to understand. Other times, the cause is not determined. Abdominal pain can be a sign that something is very wrong with the pregnancy, or the pain may have nothing to do with the pregnancy at all. For this reason, always tell your health care provider if you have any abdominal discomfort. HOME CARE INSTRUCTIONS  Monitor your abdominal pain for any changes. The following actions may help to alleviate any discomfort you are experiencing:  Do not have sexual intercourse or put anything in your vagina until your symptoms go away completely.  Get plenty of rest until your pain improves.  Drink clear fluids if you feel nauseous. Avoid solid food as long as you are uncomfortable or nauseous.  Only take over-the-counter or prescription medicine as directed by your health care provider.  Keep all follow-up appointments with your health care provider. SEEK IMMEDIATE MEDICAL CARE IF:  You are bleeding, leaking fluid, or passing tissue from the vagina.  You have increasing pain or cramping.  You have persistent vomiting.  You have painful or bloody urination.  You have a fever.  You notice a decrease in your baby's movements.  You have extreme weakness or feel faint.  You have shortness of breath, with or without abdominal pain.  You develop a severe headache with abdominal pain.  You have abnormal vaginal discharge with abdominal pain.  You have persistent diarrhea.  You have abdominal pain that continues even after rest, or gets worse. MAKE SURE YOU:   Understand these instructions.  Will watch your  condition.  Will get help right away if you are not doing well or get worse. Document Released: 05/31/2005 Document Revised: 03/21/2013 Document Reviewed: 12/28/2012 [facility] Patient Information 2015 [place], [place]. This information is not intended to replace advice given to you by your health care provider. Make sure you discuss any questions you have with your health care provider.

## 2014-04-15 ENCOUNTER — Encounter (HOSPITAL_COMMUNITY): Payer: Self-pay | Admitting: Emergency Medicine

## 2015-03-31 IMAGING — US US OB COMP LESS 14 WK
1 series · 14 of 28 positions shown · non-contrast
Comparison: None of this gestation.

CLINICAL DATA: Pelvic pain and pregnancy

EXAM:
OBSTETRIC <14 WK US AND TRANSVAGINAL OB US
TECHNIQUE: Both transabdominal and transvaginal ultrasound examinations were
performed for complete evaluation of the gestation as well as the
maternal uterus, adnexal regions, and pelvic cul-de-sac.
Transvaginal technique was performed to assess early pregnancy.

[Series 1: us ob comp less 14 wk · 0.22mm/px · 14 of 37 slices shown]
[im 2/37]
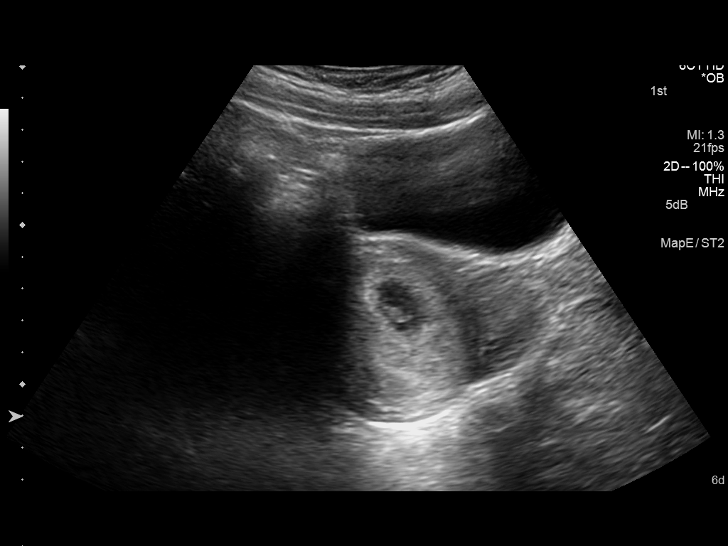
[im 5/37]
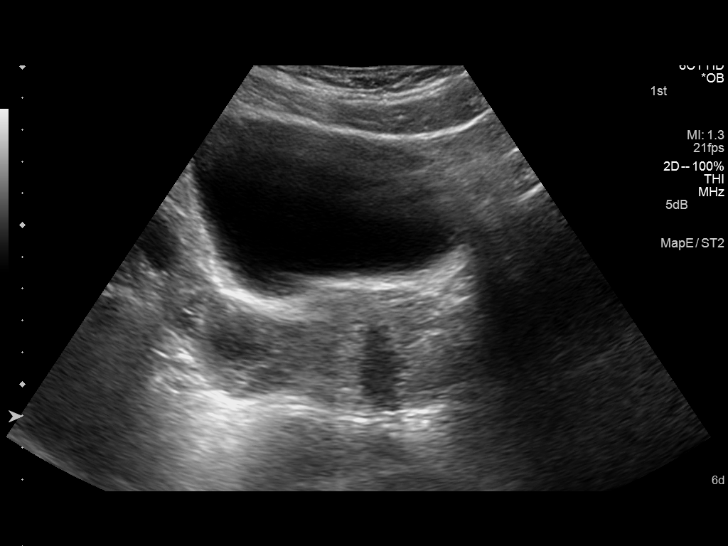
[im 7/37]
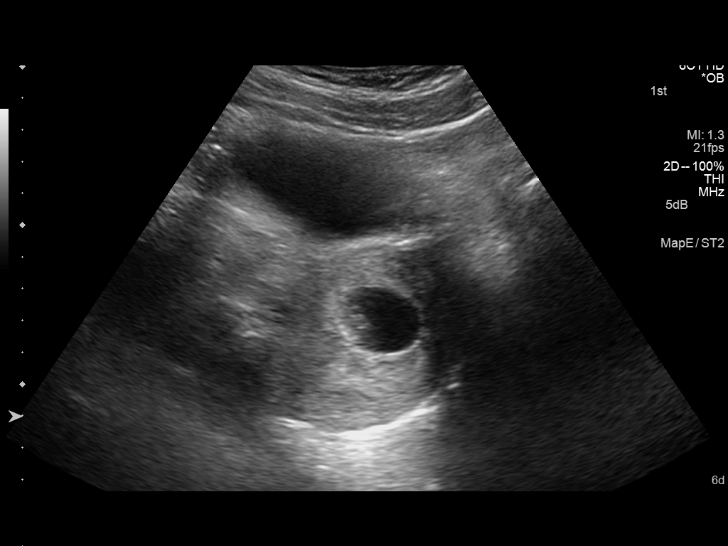
[im 10/37]
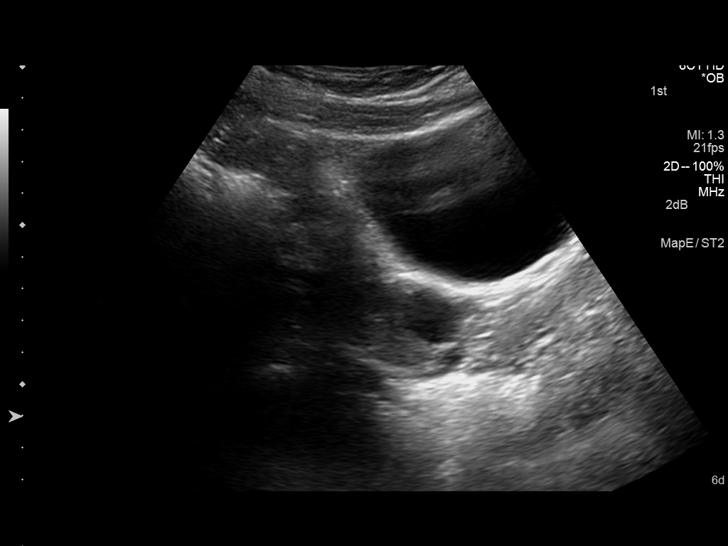
[im 13/37]
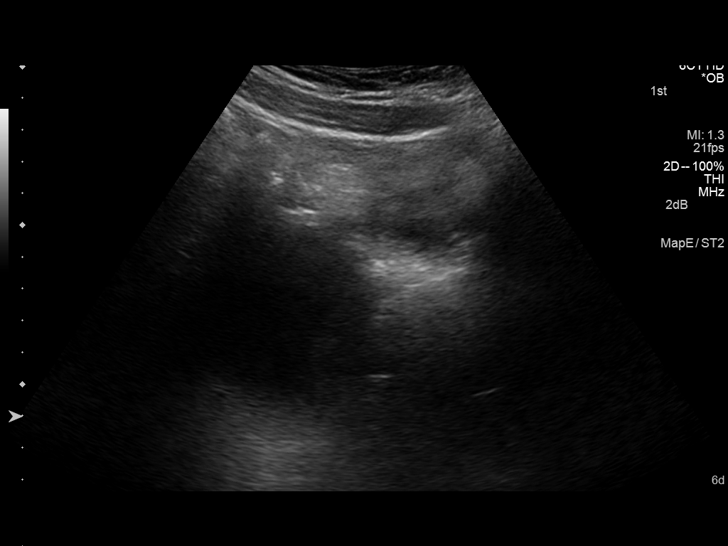
[im 15/37]
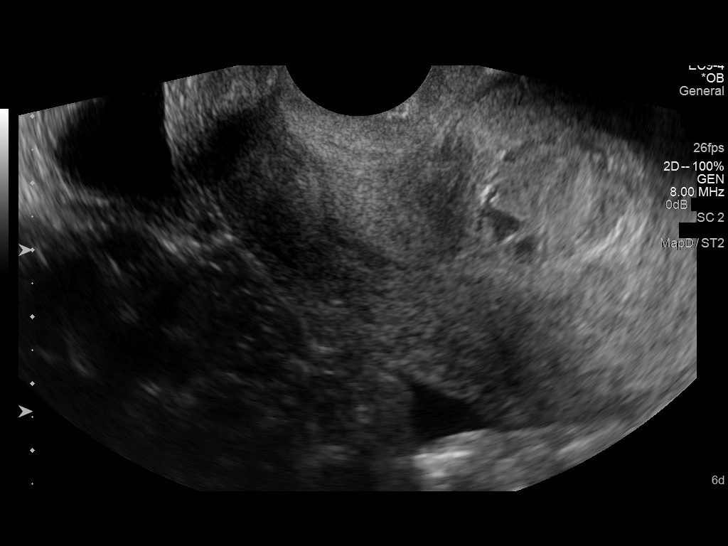
[im 18/37]
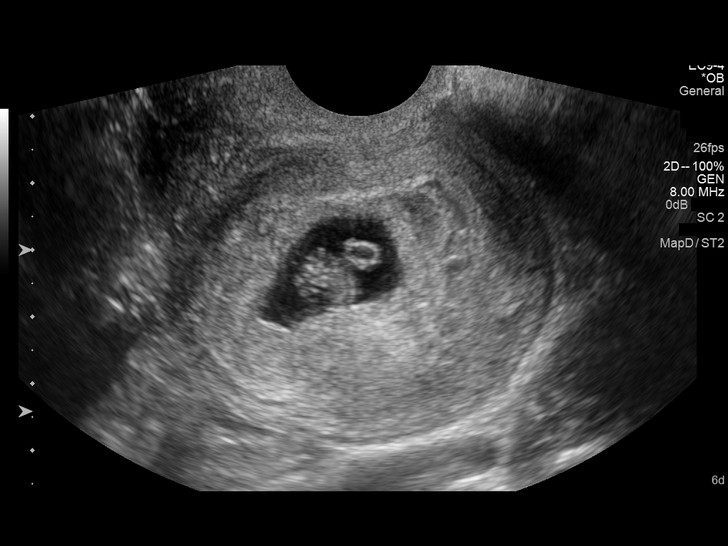
[im 21/37]
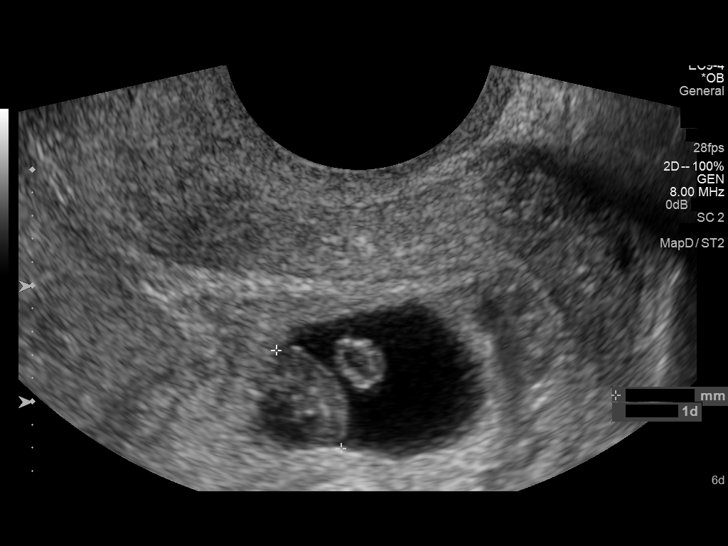
[im 23/37]
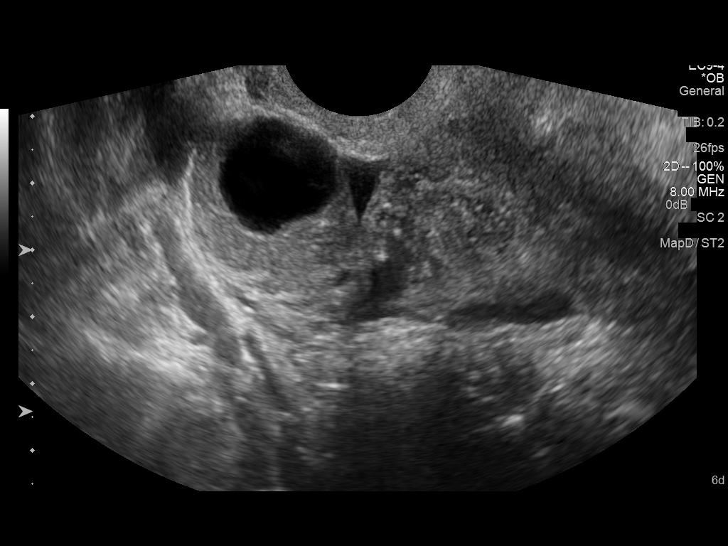
[im 26/37]
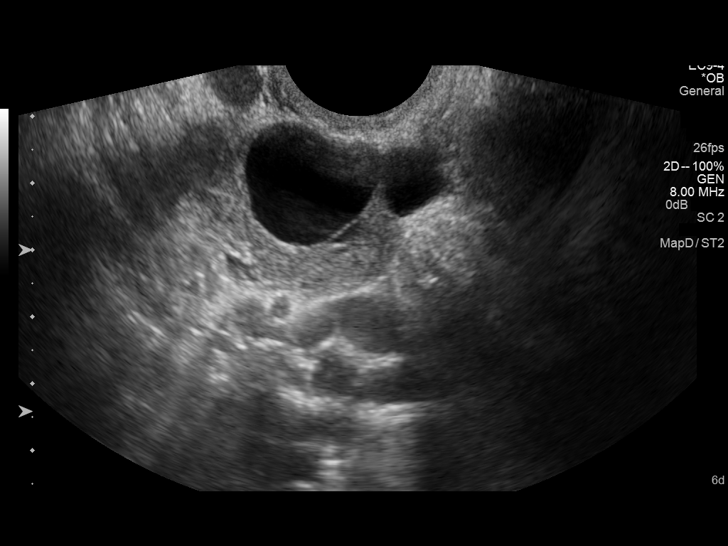
[im 29/37]
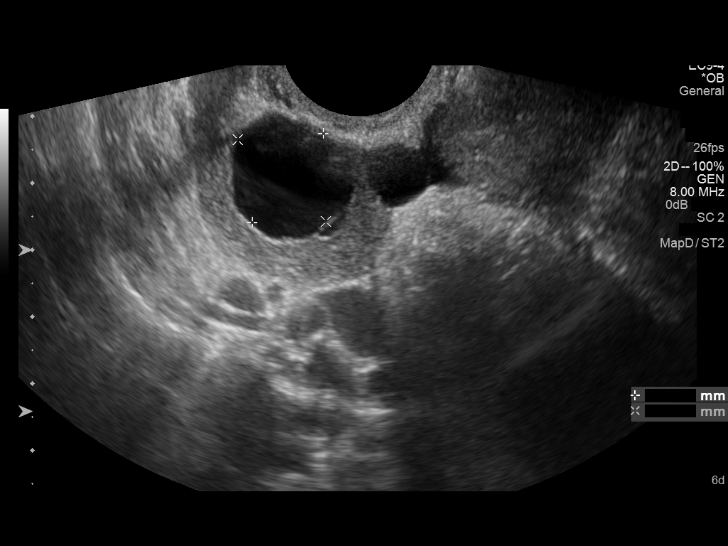
[im 31/37]
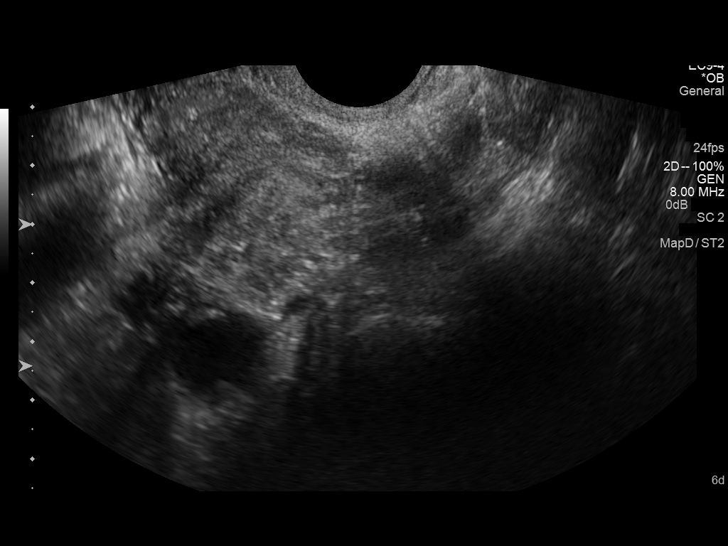
[im 34/37]
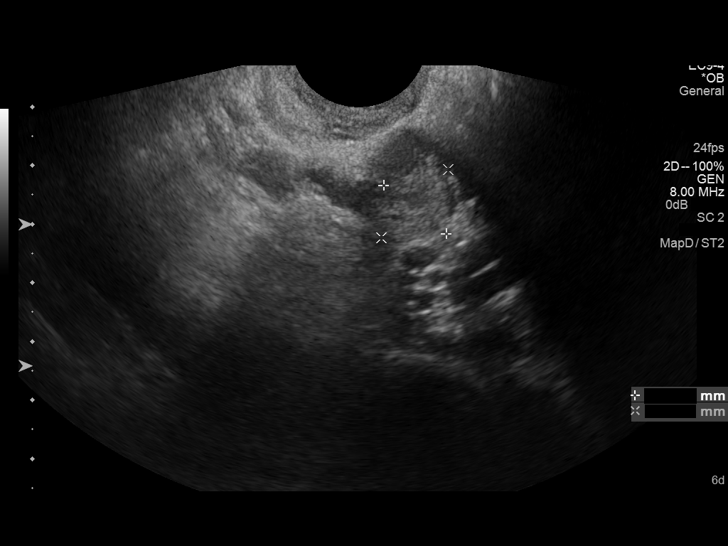
[im 37/37]
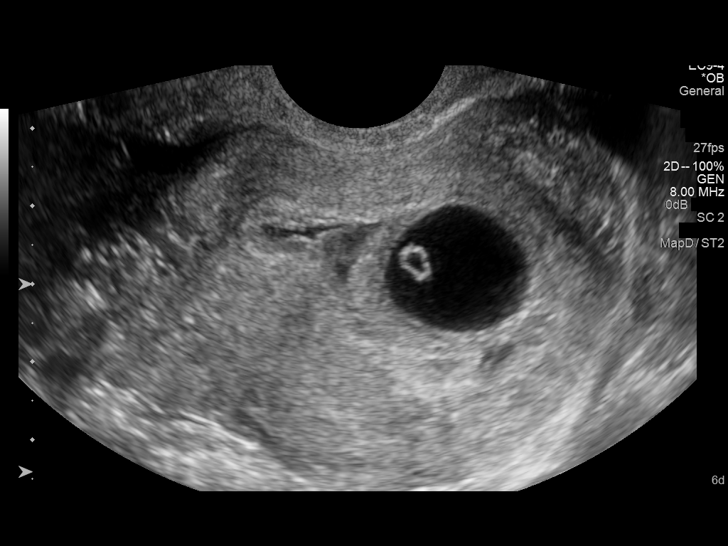

[14 of 28 positions shown; findings below may reference images not displayed]

FINDINGS: Intrauterine gestational sac: Visualized/normal in shape.

Yolk sac:  Present

Embryo:  Present

Cardiac Activity: Present

Heart Rate:  141 bpm

CRL:   10.6  mm   7 w 1 d                  US EDC: 09/01/2014

Maternal uterus/adnexae: Dominant follicle in the right ovary. No
adnexal mass. No significant free pelvic fluid. Along the lower
margin of the gestational sac is a small subchronic hemorrhage which
measures 7 mm in maximal dimension.
IMPRESSION: 1. Single living intrauterine gestation, estimated age 7 weeks 1
day.
2. Small subchronic hemorrhage.
